# Patient Record
Sex: Male | Born: 1965 | Race: White | Hispanic: No | Marital: Married | Smoking: Current every day smoker
Health system: Southern US, Academic
[De-identification: ages and names within clinical notes are randomized; demographics above are authoritative.]

## PROBLEM LIST (undated history)

## (undated) HISTORY — PX: HX KNEE SURGERY: 2100001320

---

## 1994-09-17 ENCOUNTER — Other Ambulatory Visit (HOSPITAL_COMMUNITY): Payer: Self-pay

## 2020-09-12 IMAGING — CR MRI JOINT LOWER EXTREMITY WITHOUT CONTRAST LT
4 of 6 series · 20 of 40 positions shown · non-contrast
Comparison: None.

﻿EXAM:  [id number]   MRI JOINT LOWER EXTREMITY WITHOUT CONTRAST LEFT KNEE
INDICATION: Left knee pain.
TECHNIQUE: Noncontrast multiplanar, multisequence MRI was performed.

[Series 6: PD fat-sat · sagittal · left · 3.0mm · 0.29mm/px · 8 of 30 slices shown]
[im 1/30]
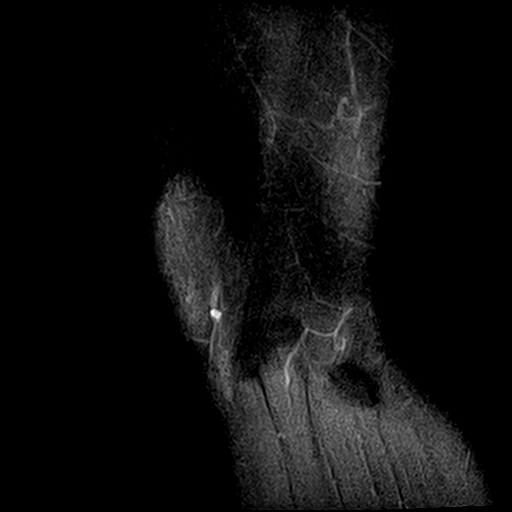
[im 5/30]
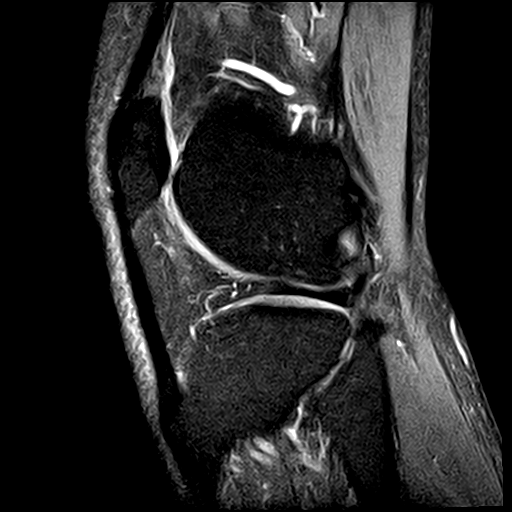
[im 9/30]
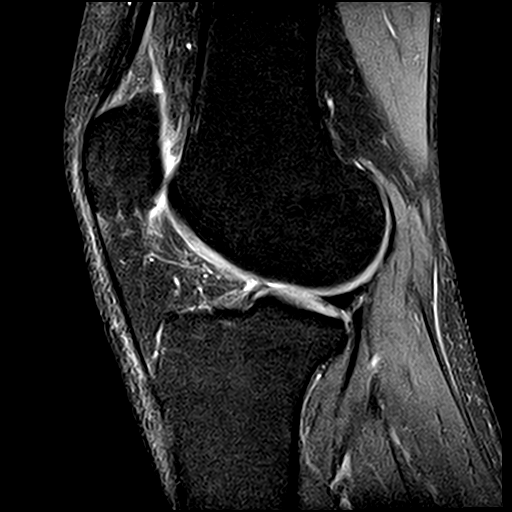
[im 13/30]
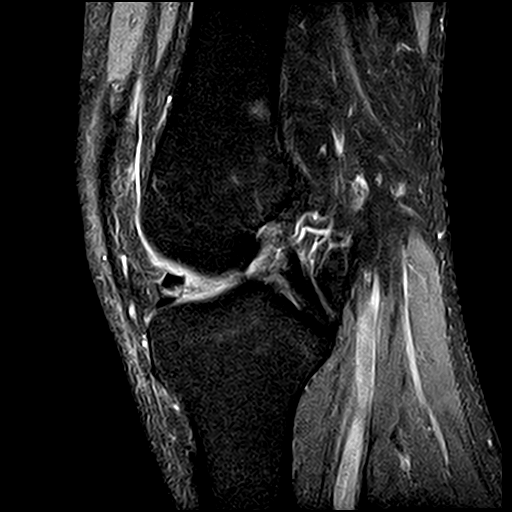
[im 17/30]
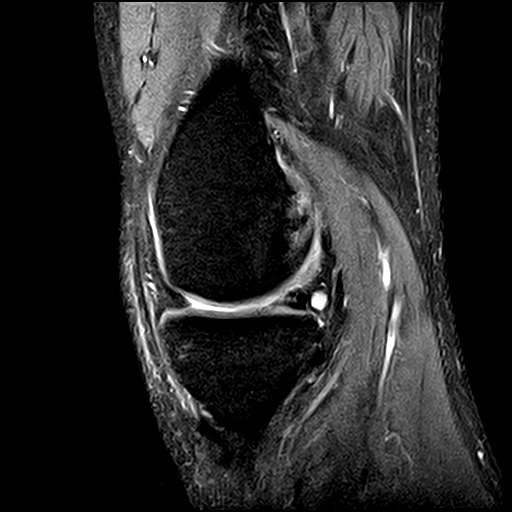
[im 21/30]
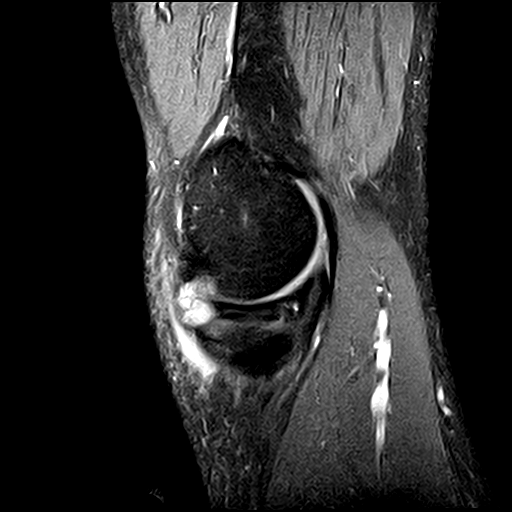
[im 25/30]
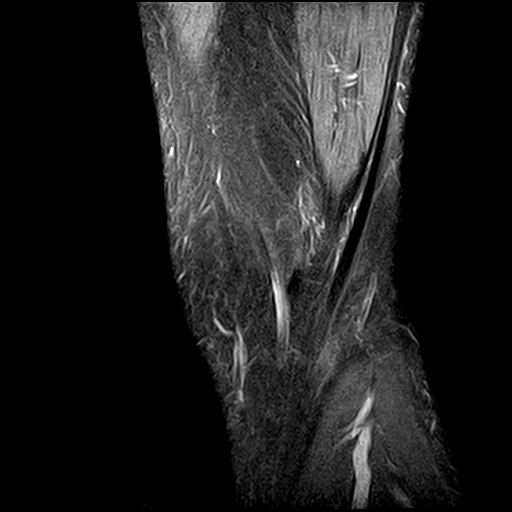
[im 30/30]
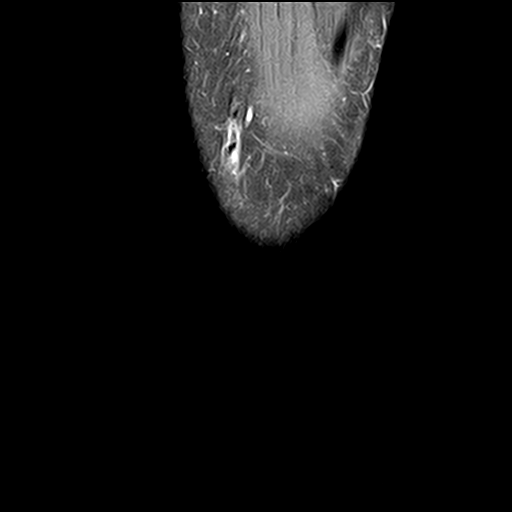

[Series 7: T1 · sagittal · left · 3.0mm · 0.29mm/px · 3 of 30 slices shown]
[im 5/30]
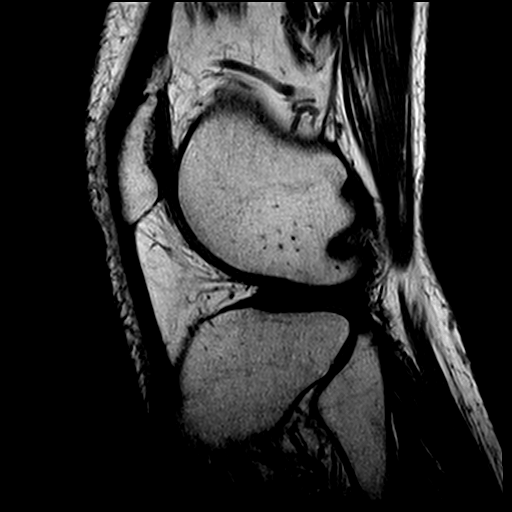
[im 17/30]
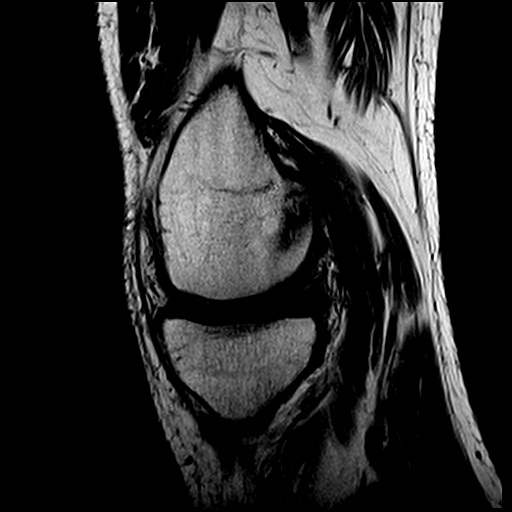
[im 25/30]
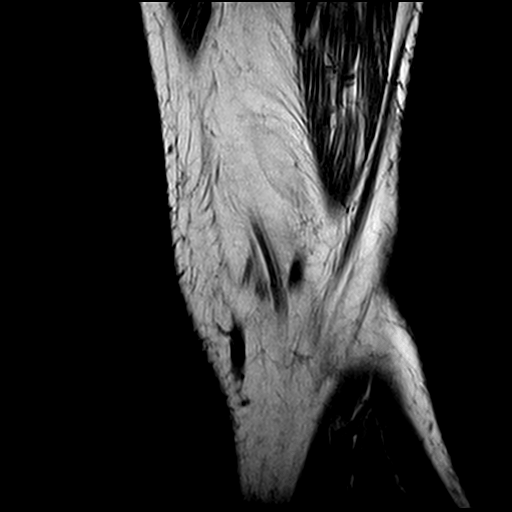

[Series 10: PD · axial · left · 4.0mm · 0.37mm/px · z∈[-100,+8]mm · 6 of 30 slices shown (1 of 2)]
[im 1/30]
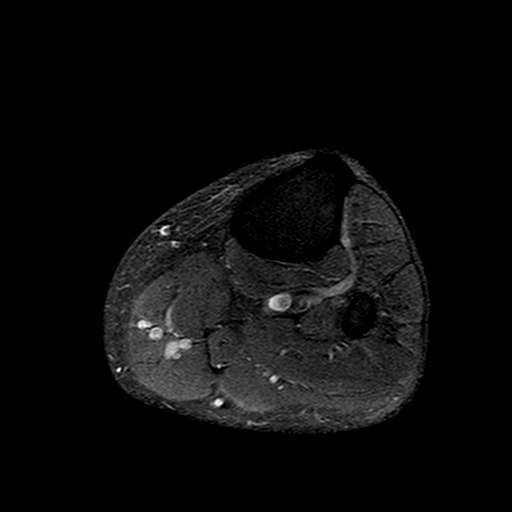
[im 5/30]
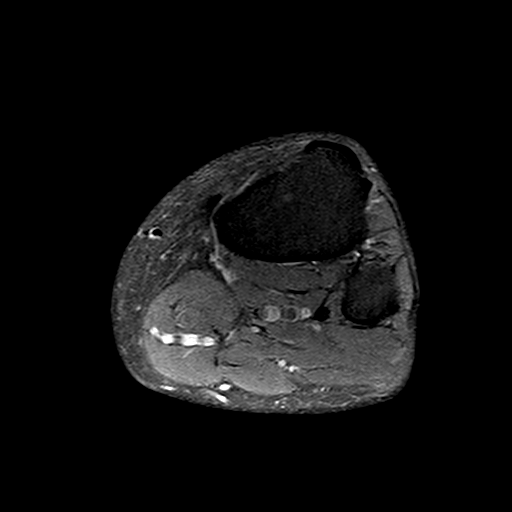
[im 9/30]
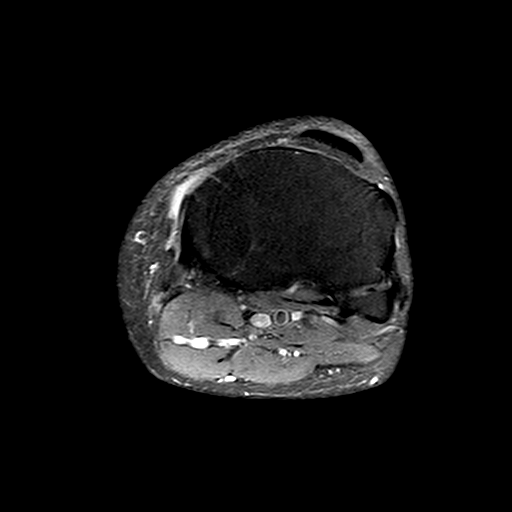
[im 13/30]
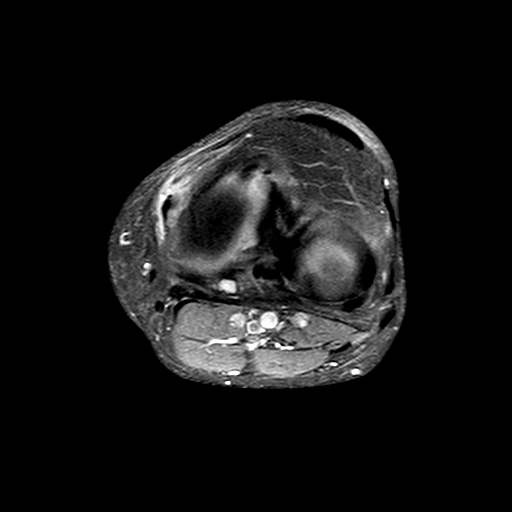
[im 17/30]
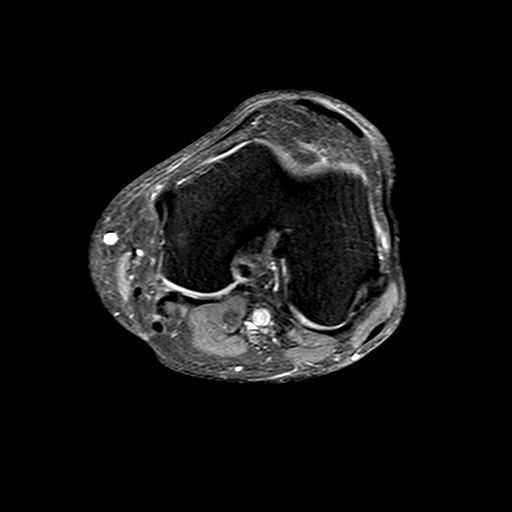
[im 25/30]
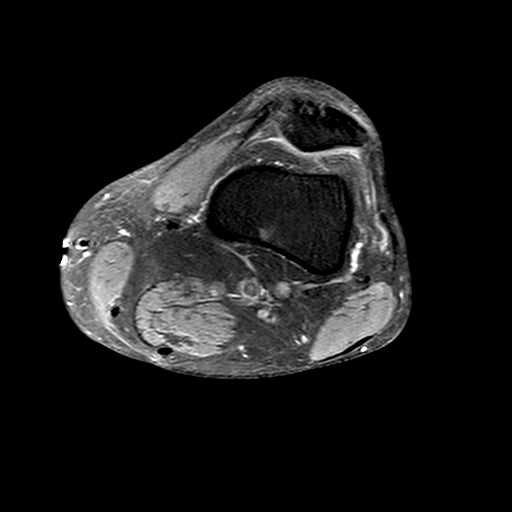

[Series 11: PD · coronal · left · 3.0mm · 0.33mm/px · 3 of 27 slices shown (2 of 2)]
[im 5/27]
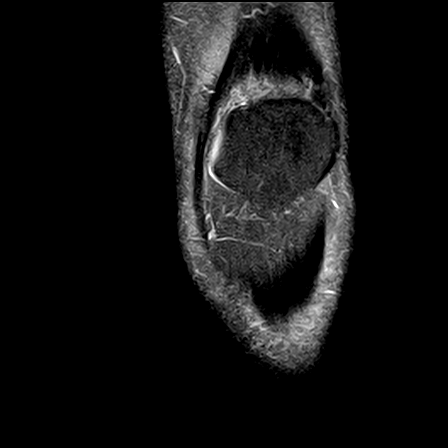
[im 14/27]
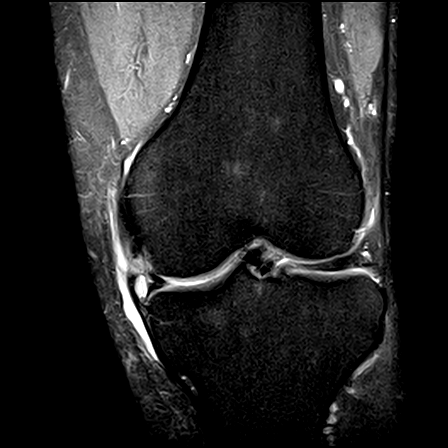
[im 22/27]
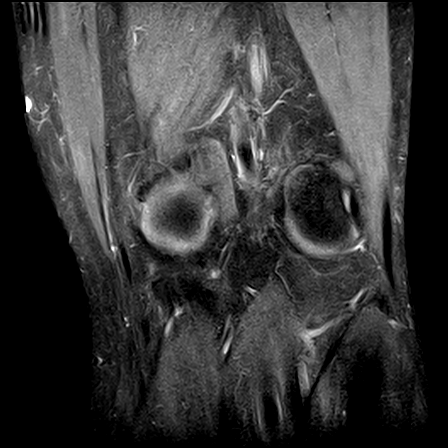

[20 of 40 positions shown; findings below may reference images not displayed]

FINDINGS: There is a small amount of joint fluid.  

There is a tear of the posterior horn of the medial meniscus that appears to extend to both of the articular surfaces.  There is associated parameniscal cyst formation.  A suspected meniscal fragment or a displaced bucket handle meniscal tear is noted in the anterior aspect of the medial joint space.  

There is fluid and edema surrounding the medial collateral ligament compatible with a sprain. 

The cruciate ligaments and extensor tendons appear intact.  There is no fracture, dislocation, bone contusion, significant marrow signal alteration, or significant degenerative change.
IMPRESSION: 1. Medial meniscus tear.

2. Medial collateral ligament sprain.

## 2021-07-30 IMAGING — MR MRI KNEE LT W/O CONTRAST
4 of 6 series · 24 of 40 positions shown · non-contrast
Comparison: Previous MRI dated 09/12/2020.

﻿EXAM:  [id number]   MRI KNEE LT W/O CONTRAST
INDICATION: 55-year-old with history of persistent left knee pain and diminished range of motion.  Previous history of meniscus surgery of the knee.
TECHNIQUE: Coronal, sagittal and axial images were obtained including T1 fat suppressed inversion recovery and proton density sequences.

[Series 6: PD fat-sat · sagittal · left · 3.0mm · 0.29mm/px · 7 of 30 slices shown (1 of 3)]
[im 1/30]
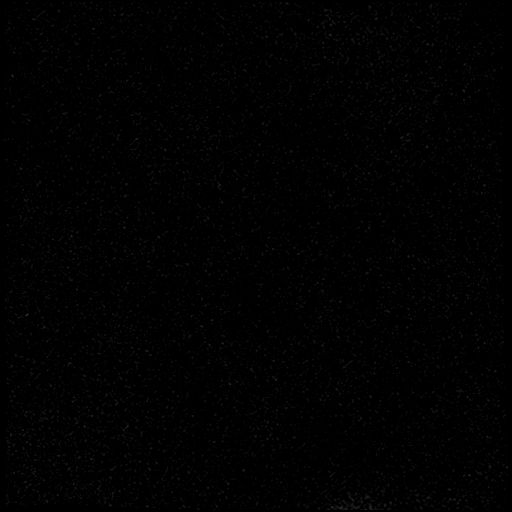
[im 5/30]
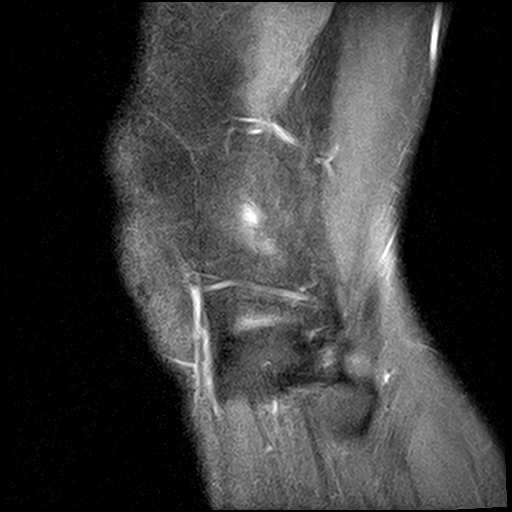
[im 10/30]
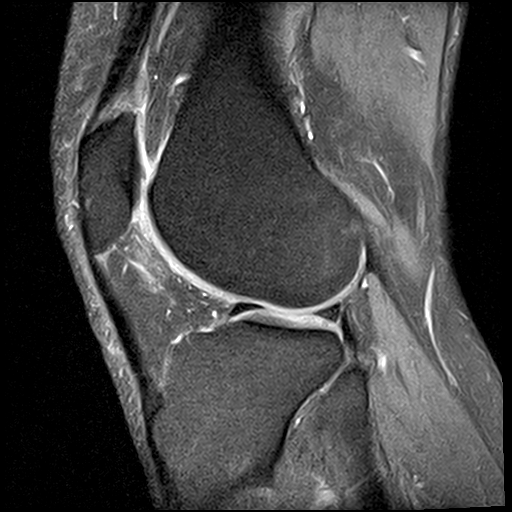
[im 15/30]
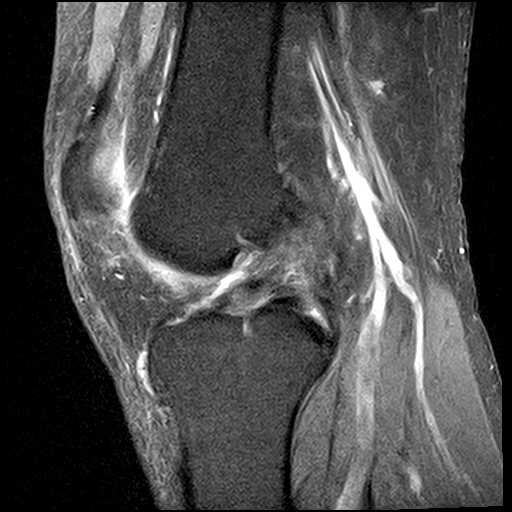
[im 20/30]
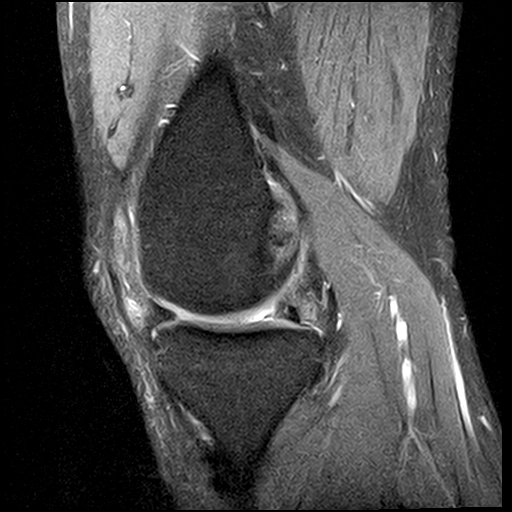
[im 25/30]
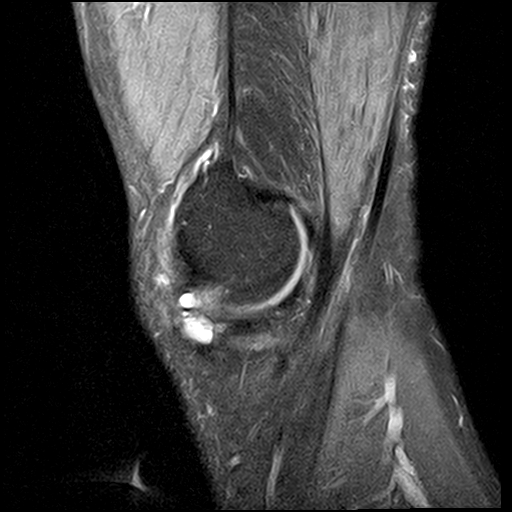
[im 30/30]
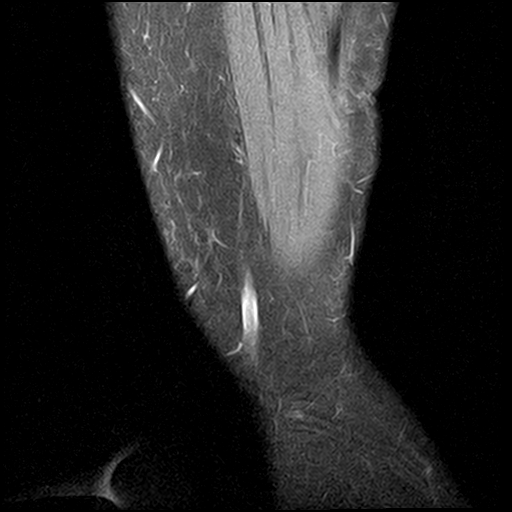

[Series 7: T1 · sagittal · left · 3.0mm · 0.29mm/px · 4 of 30 slices shown]
[im 1/30]
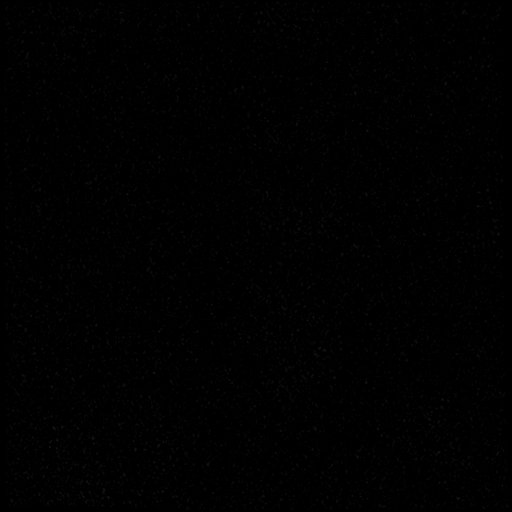
[im 5/30]
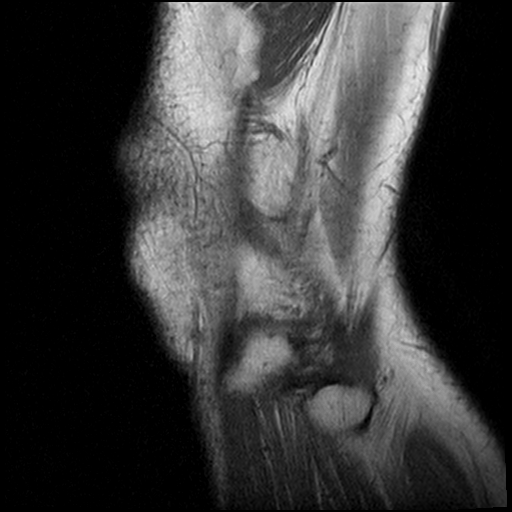
[im 15/30]
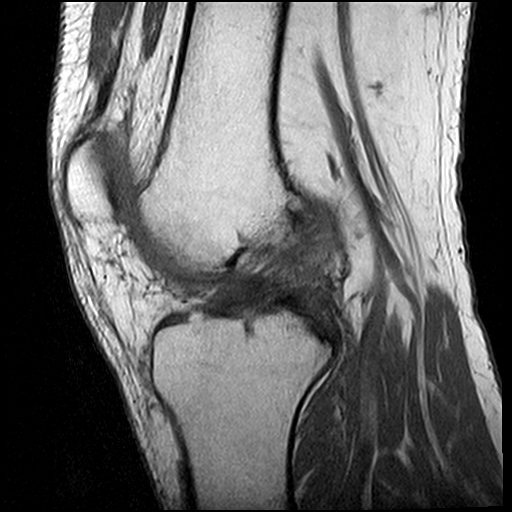
[im 25/30]
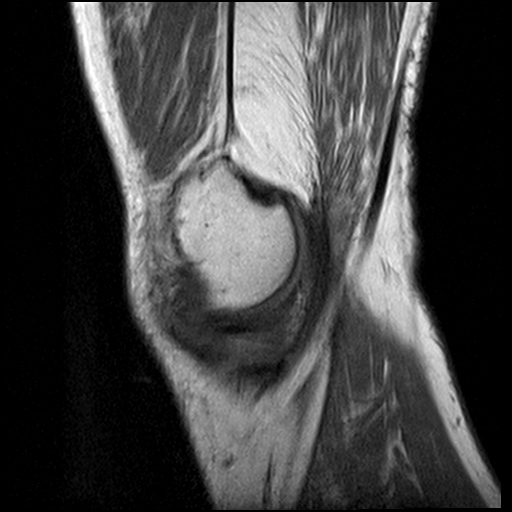

[Series 9: PD fat-sat · coronal · left · 3.0mm · 0.33mm/px · 6 of 27 slices shown (2 of 3)]
[im 1/27]
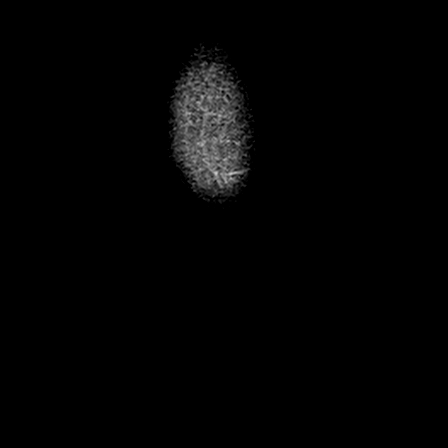
[im 6/27]
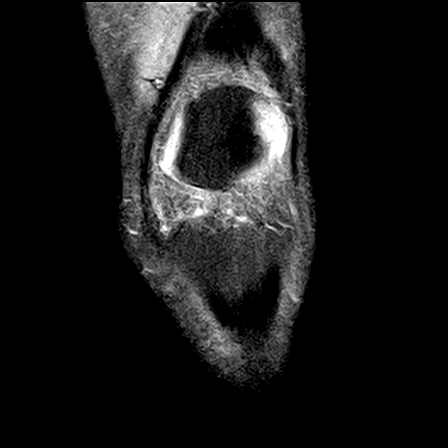
[im 11/27]
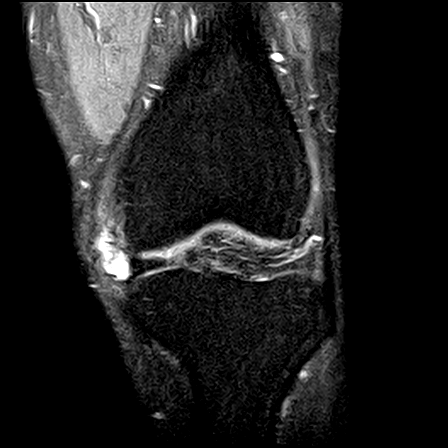
[im 16/27]
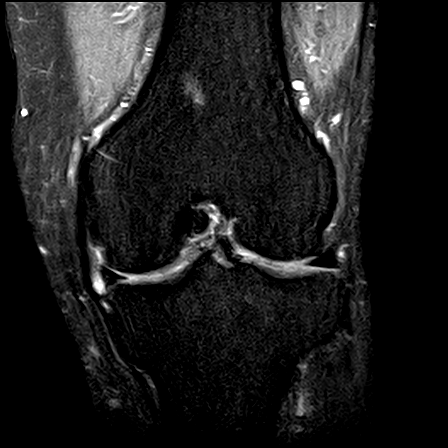
[im 21/27]
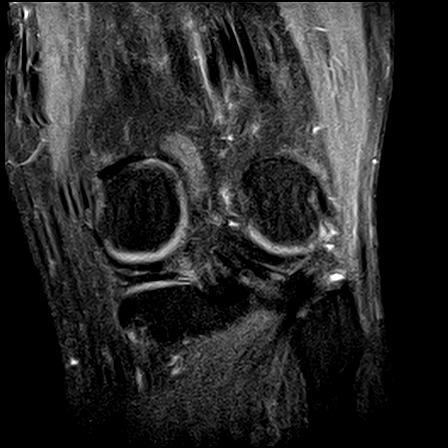
[im 27/27]
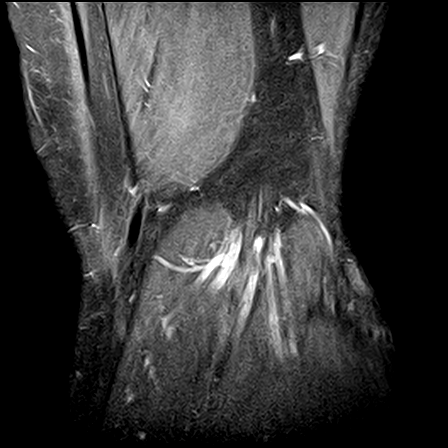

[Series 10: PD fat-sat · axial · left · 4.0mm · 0.59mm/px · z∈[-97,+34]mm · 7 of 30 slices shown (3 of 3)]
[im 1/30]
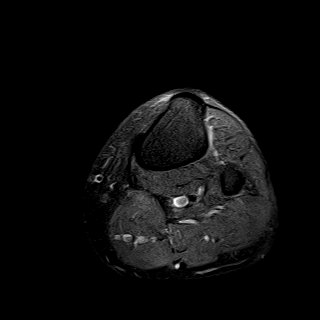
[im 5/30]
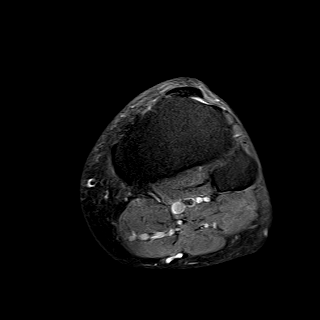
[im 10/30]
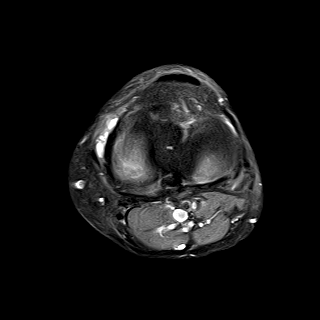
[im 15/30]
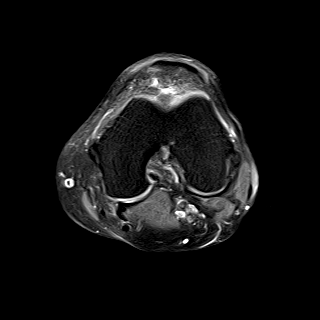
[im 20/30]
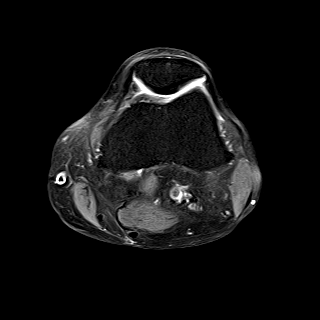
[im 25/30]
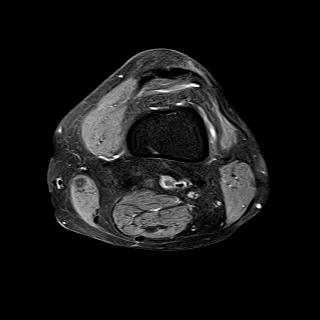
[im 30/30]
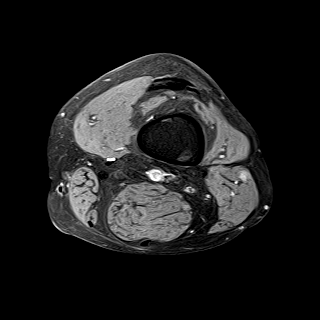

[24 of 40 positions shown; findings below may reference images not displayed]

FINDINGS: No acute bony lesions are seen on both sides of the left knee. 

Lateral meniscus and lateral articular cartilage are normal.  

Posterior cruciate ligament is intact.  Femoral attachment of anterior cruciate ligament is incompletely visualized.  There is mild anterior translocation of the proximal tibia in relation to distal femur, also noted on prior study September 2020.  

Previously noted displaced bucket handle fragment of the medial meniscal tear is no long seen.  However, remaining medial meniscus shows residual chronic horizontally oriented tear of mid and posterior horn of the medial meniscus with a good sized parameniscal cyst measuring 6 x 14 x 22 mm in size.  Grade 2 degenerative changes of medial articular cartilage are noted.  

Quadriceps tendon and patellar tendon are intact.  Soft tissues of the popliteal fossa are normal.
IMPRESSION: 1. Postsurgical changes of medial meniscus noted with the previously noted displaced bucket handle fragment no longer seen.  However, remaining medial meniscus shows chronic horizontally oriented tear of the mid posterior aspect of the medial meniscus and a good sized parameniscal cyst in the midportion of the medial meniscus periphery.  

2. Poor visualization of proximal femoral attachment of ACL and mild anterior translocation of proximal tibia in relation to distal femur are noted, also seen on the prior study of 09/12/2020 suggestive of at least incomplete tear of the proximal ACL.  

3. The medial collateral ligament is intact.  

4. Grade 2 degenerative changes of articular cartilage of medial compartment of the knee.

## 2021-11-12 IMAGING — US DUPLEX VENOUS US UNILATERAL
1 series · 14 of 24 positions shown · non-contrast
Comparison: None available.

﻿EXAM:  DUPLEX VENOUS US UNILATERAL
INDICATION: Left leg pain and swelling.

[Series 1: duplex venous us unilateral · portal-venous · 14 of 64 slices shown]
[im 1/64]
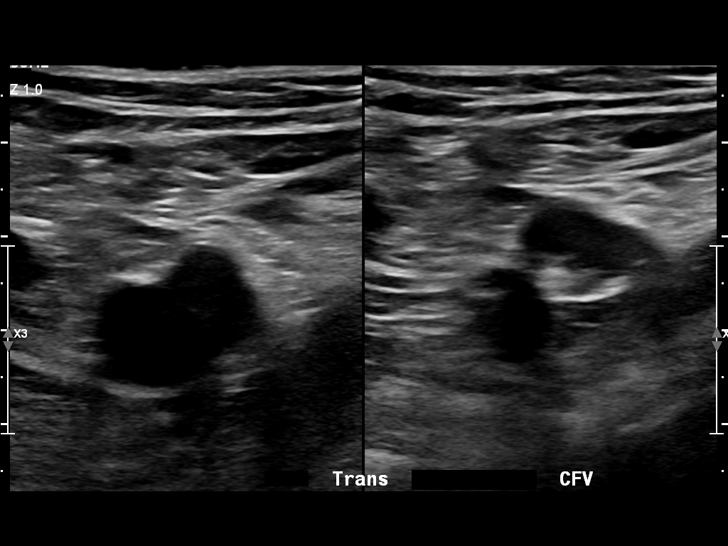
[im 6/64]
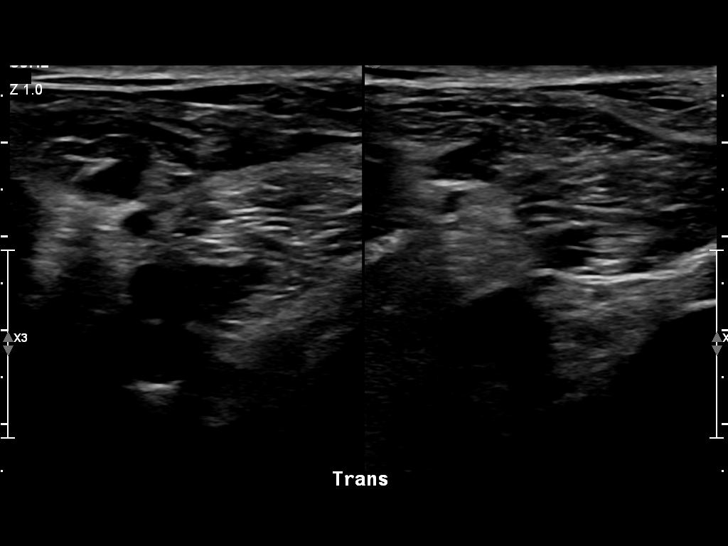
[im 11/64]
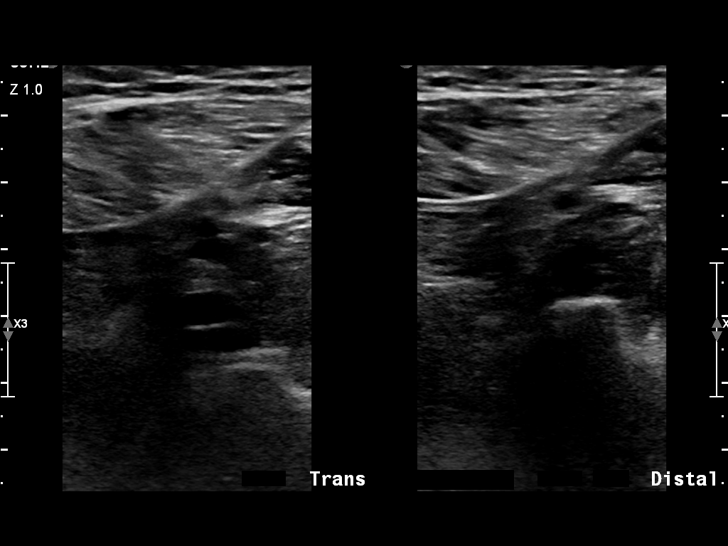
[im 17/64]
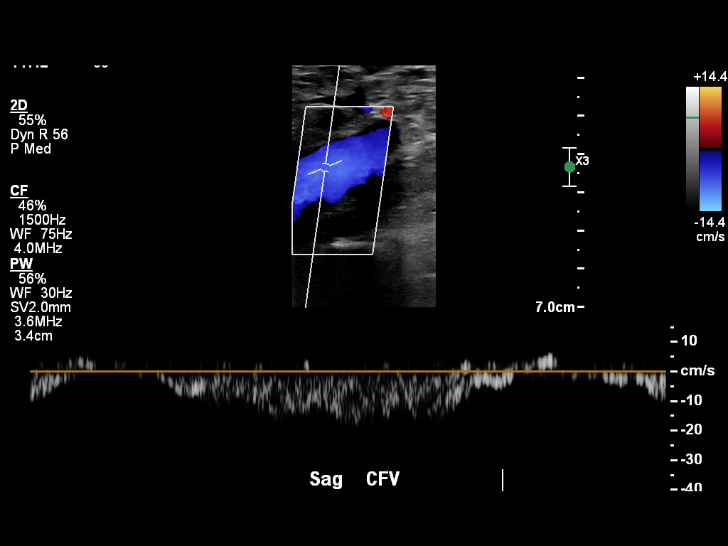
[im 20/64]
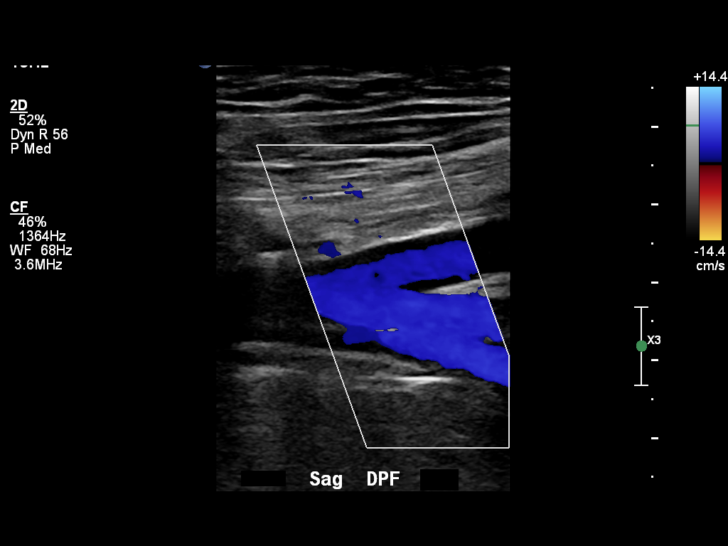
[im 25/64]
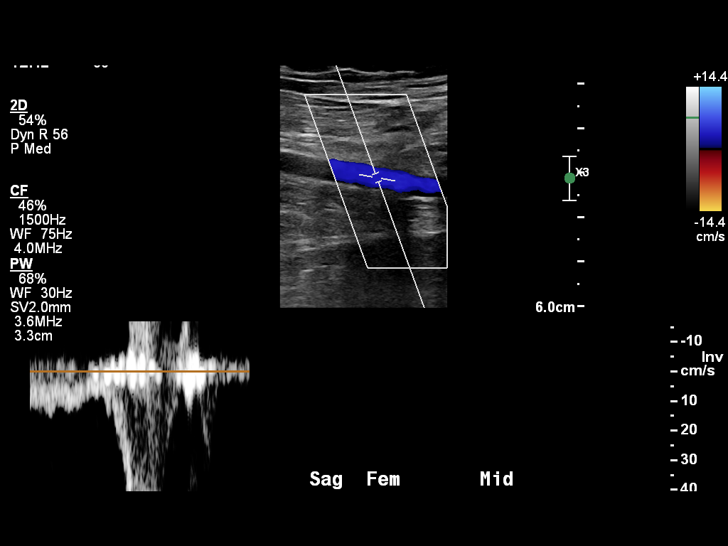
[im 31/64]
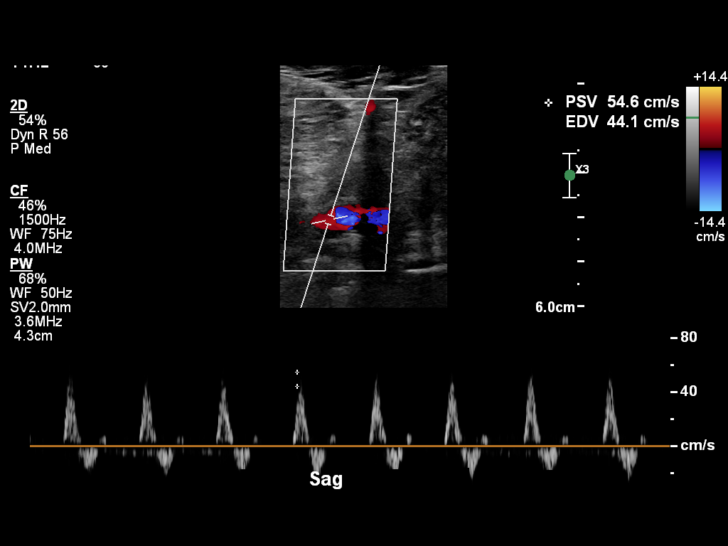
[im 33/64]
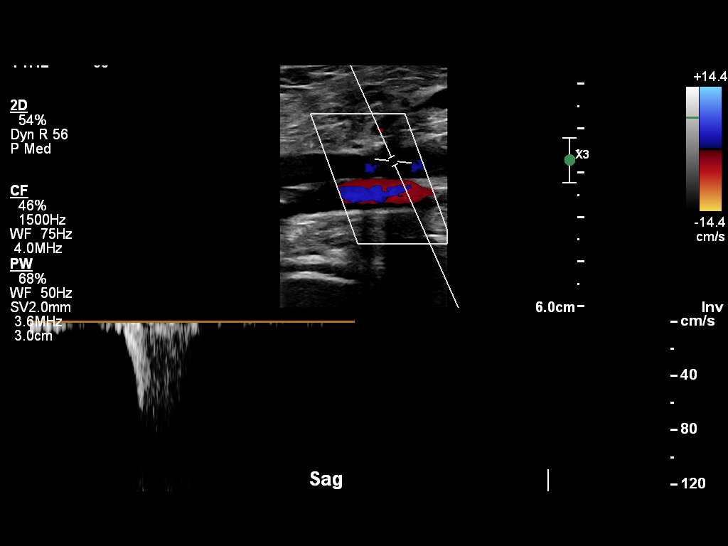
[im 39/64]
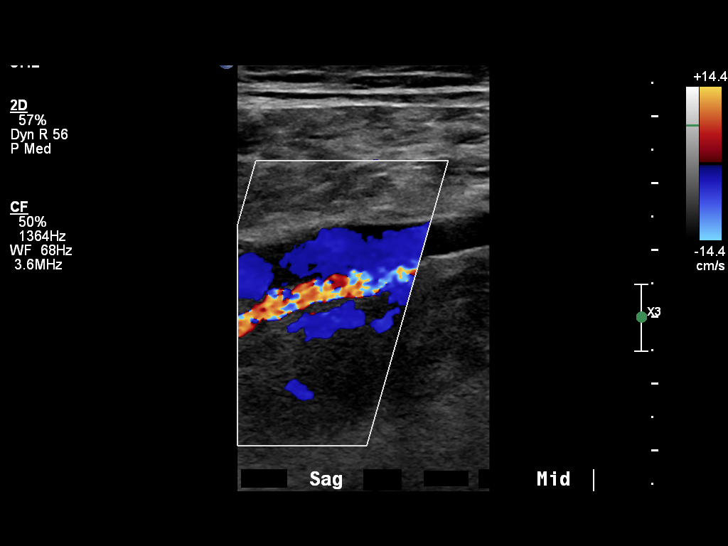
[im 44/64]
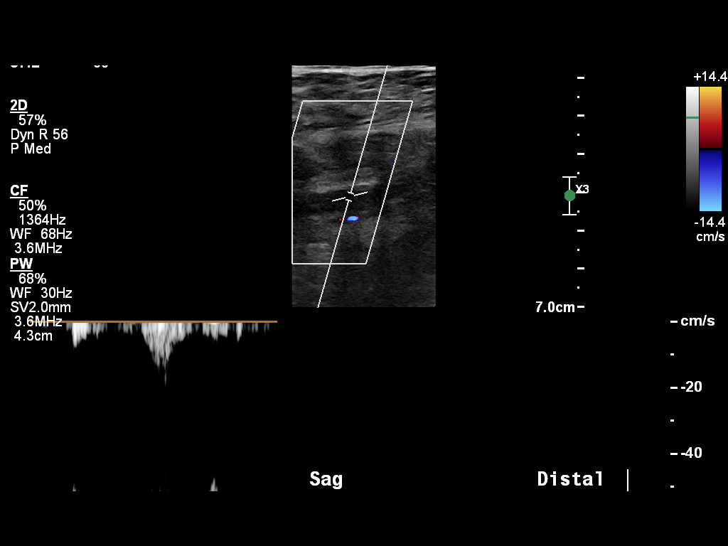
[im 50/64]
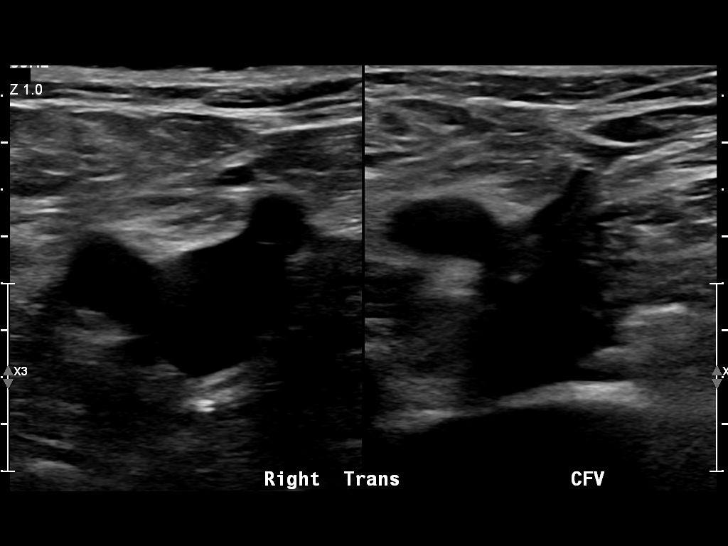
[im 53/64]
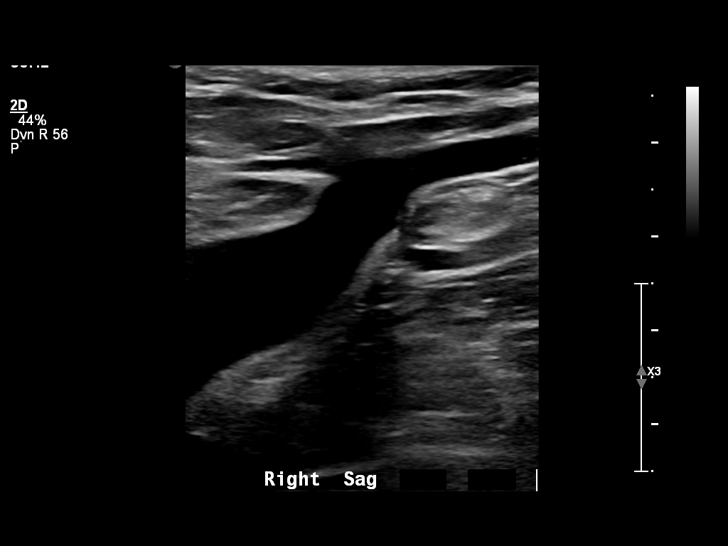
[im 58/64]
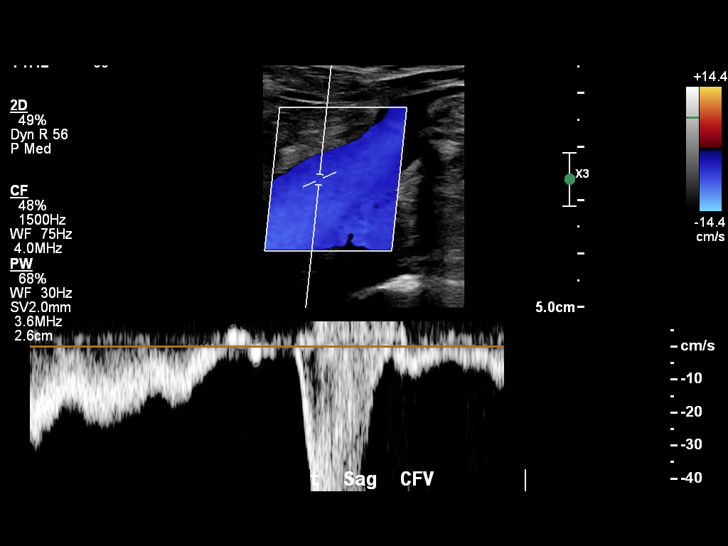
[im 64/64]
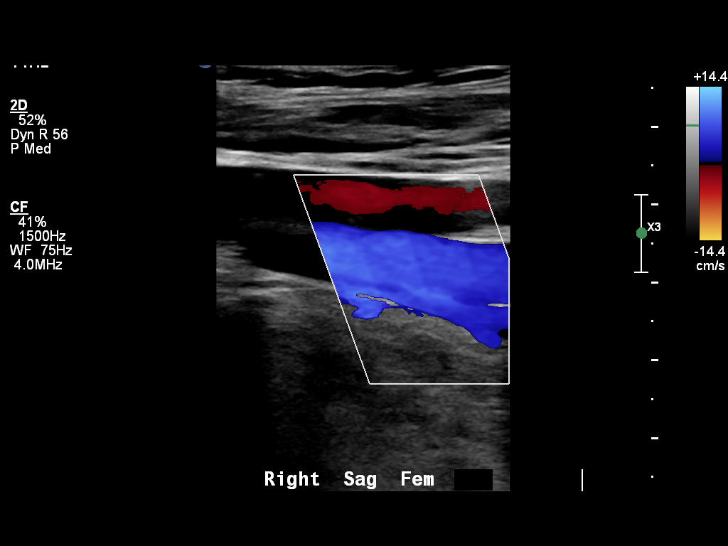

[14 of 24 positions shown; findings below may reference images not displayed]

FINDINGS: Sonographic evaluation of the left lower extremity was performed utilizing grayscale, color flow and pulsed Doppler techniques. 

The common femoral, superficial femoral and popliteal veins demonstrate normal compressibility, respiratory phasicity and augmentation. Spectral Doppler analysis is also unremarkable. The calf veins are also patent. The contralateral common femoral vein also demonstrates normal compressibility.
IMPRESSION: No evidence of left lower extremity deep venous thrombus.

## 2022-09-16 ENCOUNTER — Emergency Department (HOSPITAL_BASED_OUTPATIENT_CLINIC_OR_DEPARTMENT_OTHER): Payer: Medicaid - Out of State

## 2022-09-16 ENCOUNTER — Emergency Department
Admission: EM | Admit: 2022-09-16 | Discharge: 2022-09-16 | Disposition: A | Discharge status: Home / Self Care | Payer: Medicaid - Out of State | Attending: FAMILY PRACTICE | Admitting: FAMILY PRACTICE

## 2022-09-16 ENCOUNTER — Emergency Department (HOSPITAL_BASED_OUTPATIENT_CLINIC_OR_DEPARTMENT_OTHER): Payer: Medicaid Other

## 2022-09-16 ENCOUNTER — Other Ambulatory Visit: Payer: Self-pay

## 2022-09-16 ENCOUNTER — Encounter (HOSPITAL_BASED_OUTPATIENT_CLINIC_OR_DEPARTMENT_OTHER): Payer: Self-pay

## 2022-09-16 DIAGNOSIS — W11XXXA Fall on and from ladder, initial encounter: Secondary | ICD-10-CM | POA: Insufficient documentation

## 2022-09-16 DIAGNOSIS — S52222A Displaced transverse fracture of shaft of left ulna, initial encounter for closed fracture: Secondary | ICD-10-CM | POA: Insufficient documentation

## 2022-09-16 MED ORDER — HYDROCODONE 5 MG-ACETAMINOPHEN 325 MG TABLET
ORAL_TABLET | ORAL | Status: AC
Start: 2022-09-16 — End: 2022-09-16
  Filled 2022-09-16: qty 2

## 2022-09-16 MED ORDER — HYDROCODONE 5 MG-ACETAMINOPHEN 325 MG TABLET
2.0000 | ORAL_TABLET | ORAL | Status: AC
Start: 2022-09-16 — End: 2022-09-16
  Administered 2022-09-16: 2 via ORAL

## 2022-09-16 MED ORDER — HYDROCODONE 7.5 MG-ACETAMINOPHEN 325 MG TABLET
1.0000 | ORAL_TABLET | Freq: Four times a day (QID) | ORAL | 0 refills | Status: AC | PRN
Start: 2022-09-16 — End: 2022-09-20

## 2022-09-16 NOTE — Discharge Instructions (Addendum)
You have an ulnar fracture in the mid shaft area, does not look like it is going to need surgery, and immobilizer and sling and swath were placed.  Do have hydrocodone to take for pain if needed, you can not drive while taking this medication, and for 24 hours after the last dose.  Please not remove the immobilization gutter splint.  You may remove the sling and swath for bed or bathing.  Keep your splint clean and dry.    Dr. Lacey Jensen he was orthopedist on-call, located in [place].  Call his office in the morning for follow-up.

## 2022-09-16 NOTE — ED Triage Notes (Signed)
Patient states that he fell on Friday and hurt his left arm and has not gotten any better.  Patient states he fell off a ladder.  Denies LOC

## 2022-09-16 NOTE — ED Provider Notes (Signed)
[facility] Emergency Department  ED Primary Provider Note  History of Present Illness   Chief Complaint   Patient presents with    Arm Injury     Julian Koch is a 57 y.o. male who had concerns including Arm Injury.  Arrival: The patient arrived by Car    This 57 year old male patient presents emergency department after fall from a ladder striking his left mid forearm on a toolbox Friday.  Does have some deformity in the ulnar aspect of the forearm, pain with extension and flexion at the wrist and the elbow, and supination pronation of the wrist.  Does have swelling at the site and minimal deformity noted, likely secondary to swelling.  No other injuries or complaints.  Has no significant past medical history or surgical history other than a knee injury several years ago work.  He does smoke, occasional alcohol but denies vaping, marijuana or illicit drug use.    He was not COVID or influenza vaccinated.  He was had COVID 3 times.      History Reviewed This Encounter:  Patient's past medical, surgical, social history reviewed noted.    Physical Exam   ED Triage Vitals [09/16/22 1527]   BP (Non-Invasive) 129/87   Heart Rate 78   Respiratory Rate 18   Temperature 36.3 C (97.4 F)   SpO2 97 %   Weight 86.2 kg (190 lb)   Height 1.676 m ('5\' 6"'$ )     Physical Exam  General:  Patient does appear uncomfortable but not toxic.    Left elbow:  Atraumatic nontender to palpation including the medial and lateral epicondyles.    Forearm:  Does have some deformity swelling exquisite tenderness in the mid shaft of the ulna.  No tenderness noted to the radial aspect of the forearm.  Left wrist:  Atraumatic and no deformities noted.  He does have forearm pain with flexion-extension of the wrist.  He does have some mild tenderness noted on the flexor surface of the wrist with a bruise noted.    Neurovascular:  Intact distal to the injuries.      Patient Data   Labs Ordered/Reviewed - No  data to display  XR FOREARM LEFT   Final Result by Edi, Radresults In (03/13 1703)   Minimally displaced oblique fracture of the mid ulna.             Radiologist location ID: [id number]         XR WRIST LEFT   Final Result by Edi, Radresults In (03/13 1704)   No definite acute appearing fractures of the radius or ulna. See above discussion                Radiologist location ID: [place] Decision Making        Medical Decision Making  Moderate complexity straightforward differential includes bone bruise, contusion, fracture.    Amount and/or Complexity of Data Reviewed  Radiology: ordered.     Details: Mid shaft ulnar fracture, minimal displacement    Risk  Prescription drug management.  Risk Details: Patient had immobilization applied, and sling.  Analgesia.  We will need to see Orthopedics, see discharge             Medications Administered in the ED   HYDROcodone-acetaminophen (NORCO) 5-325 mg per tablet (2 Tablets Oral Given 09/16/22 1659)     Clinical Impression   Closed displaced  transverse fracture of shaft of left ulna, initial encounter (Primary)   Fall from ladder, initial encounter       Disposition: Discharged

## 2022-09-16 NOTE — ED Nurses Note (Signed)
Posterior splint applied  awaiting a CD for patient to take
